# Patient Record
Sex: Female | Born: 1935 | Race: White | Hispanic: No
Health system: Southern US, Community
[De-identification: ages and names within clinical notes are randomized; demographics above are authoritative.]

---

## 2005-07-10 ENCOUNTER — Ambulatory Visit: Payer: Self-pay | Admitting: Family Medicine

## 2006-05-07 ENCOUNTER — Ambulatory Visit: Payer: Self-pay | Admitting: Family Medicine

## 2006-05-21 ENCOUNTER — Ambulatory Visit: Payer: Self-pay | Admitting: Family Medicine

## 2007-07-01 ENCOUNTER — Other Ambulatory Visit: Payer: Self-pay

## 2007-07-01 ENCOUNTER — Ambulatory Visit: Payer: Self-pay | Admitting: Urology

## 2007-07-15 ENCOUNTER — Ambulatory Visit: Payer: Self-pay | Admitting: Urology

## 2007-11-13 ENCOUNTER — Ambulatory Visit: Payer: Self-pay | Admitting: Family Medicine

## 2009-08-30 ENCOUNTER — Ambulatory Visit: Payer: Self-pay | Admitting: Urology

## 2009-09-08 ENCOUNTER — Ambulatory Visit: Payer: Self-pay | Admitting: Urology

## 2009-09-20 ENCOUNTER — Ambulatory Visit: Payer: Self-pay | Admitting: Urology

## 2012-05-29 ENCOUNTER — Inpatient Hospital Stay: Payer: Self-pay | Admitting: Orthopedic Surgery

## 2012-05-29 LAB — URINALYSIS, COMPLETE
Ketone: NEGATIVE
Nitrite: NEGATIVE
RBC,UR: 4 /HPF (ref 0–5)
Squamous Epithelial: 1
WBC UR: 2 /HPF (ref 0–5)

## 2012-05-29 LAB — BASIC METABOLIC PANEL
BUN: 13 mg/dL (ref 7–18)
Calcium, Total: 9.1 mg/dL (ref 8.5–10.1)
EGFR (African American): >60
Glucose: 110 mg/dL — ABNORMAL HIGH (ref 65–99)
Osmolality: 276 (ref 275–301)

## 2012-05-29 LAB — PROTIME-INR: INR: 0.8

## 2012-06-02 ENCOUNTER — Encounter: Payer: Self-pay | Admitting: Internal Medicine

## 2012-06-09 ENCOUNTER — Encounter: Payer: Self-pay | Admitting: Internal Medicine

## 2012-09-10 ENCOUNTER — Ambulatory Visit: Payer: Self-pay | Admitting: Ophthalmology

## 2012-09-10 LAB — POTASSIUM: Potassium: 4.4 mmol/L (ref 3.5–5.1)

## 2012-09-22 ENCOUNTER — Ambulatory Visit: Payer: Self-pay | Admitting: Ophthalmology

## 2012-10-28 ENCOUNTER — Ambulatory Visit: Payer: Self-pay | Admitting: Ophthalmology

## 2012-10-28 LAB — POTASSIUM: Potassium: 3.8 mmol/L (ref 3.5–5.1)

## 2012-11-10 ENCOUNTER — Ambulatory Visit: Payer: Self-pay | Admitting: Ophthalmology

## 2012-12-01 ENCOUNTER — Ambulatory Visit: Payer: Self-pay | Admitting: Family Medicine

## 2012-12-01 LAB — CBC WITH DIFFERENTIAL/PLATELET
Basophil #: 0.1 10*3/uL (ref 0.0–0.1)
Basophil %: 1.1 %
Eosinophil #: 0.1 10*3/uL (ref 0.0–0.7)
HGB: 11.7 g/dL — ABNORMAL LOW (ref 12.0–16.0)
Lymphocyte #: 2.1 10*3/uL (ref 1.0–3.6)
MCH: 30.2 pg (ref 26.0–34.0)
MCHC: 32.1 g/dL (ref 32.0–36.0)
Monocyte #: 0.8 x10 3/mm (ref 0.2–0.9)
Neutrophil #: 7.2 10*3/uL — ABNORMAL HIGH (ref 1.4–6.5)
Neutrophil %: 70 %
Platelet: 283 10*3/uL (ref 150–440)

## 2012-12-01 LAB — COMPREHENSIVE METABOLIC PANEL
Alkaline Phosphatase: 86 U/L (ref 50–136)
Anion Gap: 7 (ref 7–16)
Bilirubin,Total: 0.8 mg/dL (ref 0.2–1.0)
Calcium, Total: 9.1 mg/dL (ref 8.5–10.1)
Chloride: 105 mmol/L (ref 98–107)
Co2: 26 mmol/L (ref 21–32)
Creatinine: 0.99 mg/dL (ref 0.60–1.30)
EGFR (African American): >60
EGFR (Non-African Amer.): 52 — ABNORMAL LOW
Osmolality: 276 (ref 275–301)
Potassium: 4.1 mmol/L (ref 3.5–5.1)
SGOT(AST): 27 U/L (ref 15–37)
Sodium: 138 mmol/L (ref 136–145)

## 2012-12-01 LAB — TSH: Thyroid Stimulating Horm: 1.16 u[IU]/mL

## 2012-12-01 LAB — PRO B NATRIURETIC PEPTIDE: B-Type Natriuretic Peptide: 9293 pg/mL — ABNORMAL HIGH (ref 0–450)

## 2012-12-03 ENCOUNTER — Inpatient Hospital Stay: Payer: Self-pay | Admitting: Internal Medicine

## 2012-12-03 LAB — CBC
HGB: 12.2 g/dL (ref 12.0–16.0)
MCH: 31.2 pg (ref 26.0–34.0)
MCHC: 32.7 g/dL (ref 32.0–36.0)
MCV: 96 fL (ref 80–100)
Platelet: 272 10*3/uL (ref 150–440)
RBC: 3.92 10*6/uL (ref 3.80–5.20)
RDW: 13.6 % (ref 11.5–14.5)
WBC: 10.2 10*3/uL (ref 3.6–11.0)

## 2012-12-03 LAB — COMPREHENSIVE METABOLIC PANEL
Anion Gap: 9 (ref 7–16)
BUN: 14 mg/dL (ref 7–18)
Bilirubin,Total: 0.6 mg/dL (ref 0.2–1.0)
Calcium, Total: 8.5 mg/dL (ref 8.5–10.1)
Chloride: 104 mmol/L (ref 98–107)
EGFR (African American): 48 — ABNORMAL LOW
Glucose: 124 mg/dL — ABNORMAL HIGH (ref 65–99)
Potassium: 3.8 mmol/L (ref 3.5–5.1)
SGOT(AST): 34 U/L (ref 15–37)
SGPT (ALT): 20 U/L (ref 12–78)

## 2012-12-03 LAB — TSH: Thyroid Stimulating Horm: 0.925 u[IU]/mL

## 2012-12-03 LAB — CK TOTAL AND CKMB (NOT AT ARMC): CK-MB: 4.5 ng/mL — ABNORMAL HIGH (ref 0.5–3.6)

## 2012-12-03 LAB — PRO B NATRIURETIC PEPTIDE: B-Type Natriuretic Peptide: 10066 pg/mL — ABNORMAL HIGH (ref 0–450)

## 2012-12-04 LAB — LIPID PANEL
Cholesterol: 141 mg/dL (ref 0–200)
HDL Cholesterol: 45 mg/dL (ref 40–60)
Ldl Cholesterol, Calc: 66 mg/dL (ref 0–100)
Triglycerides: 152 mg/dL (ref 0–200)
VLDL Cholesterol, Calc: 30 mg/dL (ref 5–40)

## 2012-12-04 LAB — CBC WITH DIFFERENTIAL/PLATELET
Basophil #: 0.1 10*3/uL (ref 0.0–0.1)
Basophil %: 1 %
Eosinophil #: 0.3 10*3/uL (ref 0.0–0.7)
HCT: 38.2 % (ref 35.0–47.0)
HGB: 12.7 g/dL (ref 12.0–16.0)
Lymphocyte %: 19.3 %
Monocyte %: 8.8 %
Neutrophil %: 68.4 %
Platelet: 274 10*3/uL (ref 150–440)
RBC: 4.07 10*6/uL (ref 3.80–5.20)
RDW: 13.7 % (ref 11.5–14.5)
WBC: 11.8 10*3/uL — ABNORMAL HIGH (ref 3.6–11.0)

## 2012-12-04 LAB — BASIC METABOLIC PANEL
Anion Gap: 11 (ref 7–16)
Calcium, Total: 8.7 mg/dL (ref 8.5–10.1)
Co2: 24 mmol/L (ref 21–32)
EGFR (Non-African Amer.): 41 — ABNORMAL LOW
Glucose: 103 mg/dL — ABNORMAL HIGH (ref 65–99)
Osmolality: 276 (ref 275–301)
Potassium: 4.3 mmol/L (ref 3.5–5.1)

## 2012-12-05 LAB — CBC WITH DIFFERENTIAL/PLATELET
Basophil #: 0 10*3/uL (ref 0.0–0.1)
Eosinophil %: 0.1 %
HCT: 38.6 % (ref 35.0–47.0)
HGB: 13 g/dL (ref 12.0–16.0)
MCH: 31.5 pg (ref 26.0–34.0)
MCHC: 33.6 g/dL (ref 32.0–36.0)
Monocyte #: 2 x10 3/mm — ABNORMAL HIGH (ref 0.2–0.9)
Monocyte %: 11.8 %
Neutrophil %: 78.8 %
Platelet: 296 10*3/uL (ref 150–440)
RBC: 4.12 10*6/uL (ref 3.80–5.20)
RDW: 13.6 % (ref 11.5–14.5)

## 2012-12-05 LAB — BASIC METABOLIC PANEL
Anion Gap: 10 (ref 7–16)
Calcium, Total: 9.1 mg/dL (ref 8.5–10.1)
Chloride: 93 mmol/L — ABNORMAL LOW (ref 98–107)
Co2: 28 mmol/L (ref 21–32)
EGFR (African American): 38 — ABNORMAL LOW
Potassium: 4.7 mmol/L (ref 3.5–5.1)
Sodium: 131 mmol/L — ABNORMAL LOW (ref 136–145)

## 2012-12-05 LAB — MAGNESIUM: Magnesium: 2.4 mg/dL

## 2012-12-06 LAB — CBC WITH DIFFERENTIAL/PLATELET
Basophil #: 0.1 10*3/uL (ref 0.0–0.1)
Basophil #: 0.1 10*3/uL (ref 0.0–0.1)
Basophil %: 0.6 %
Eosinophil #: 0 10*3/uL (ref 0.0–0.7)
Eosinophil #: 0 10*3/uL (ref 0.0–0.7)
HCT: 37.2 % (ref 35.0–47.0)
HCT: 37.1 % (ref 35.0–47.0)
HGB: 12.7 g/dL (ref 12.0–16.0)
Lymphocyte #: 1.9 10*3/uL (ref 1.0–3.6)
Lymphocyte %: 12.7 %
MCHC: 34 g/dL (ref 32.0–36.0)
MCHC: 33 g/dL (ref 32.0–36.0)
Monocyte %: 7.3 %
Monocyte %: 8.6 %
Neutrophil #: 14.7 10*3/uL — ABNORMAL HIGH (ref 1.4–6.5)
Neutrophil #: 11.2 10*3/uL — ABNORMAL HIGH (ref 1.4–6.5)
Neutrophil %: 77.9 %
Platelet: 271 10*3/uL (ref 150–440)
RBC: 3.92 10*6/uL (ref 3.80–5.20)
RBC: 3.93 10*6/uL (ref 3.80–5.20)
RDW: 13.8 % (ref 11.5–14.5)
WBC: 18.1 10*3/uL — ABNORMAL HIGH (ref 3.6–11.0)
WBC: 14.4 10*3/uL — ABNORMAL HIGH (ref 3.6–11.0)

## 2012-12-06 LAB — URINALYSIS, COMPLETE
Bacteria: NONE SEEN
Bilirubin,UR: NEGATIVE
Glucose,UR: NEGATIVE mg/dL (ref 0–75)
Hyaline Cast: 4
Ketone: NEGATIVE
Leukocyte Esterase: NEGATIVE
Nitrite: NEGATIVE
Ph: 5 (ref 4.5–8.0)
Squamous Epithelial: NONE SEEN
WBC UR: 7 /HPF (ref 0–5)

## 2012-12-06 LAB — TROPONIN I: Troponin-I: 0.75 ng/mL — ABNORMAL HIGH

## 2012-12-06 LAB — BASIC METABOLIC PANEL
Calcium, Total: 8.2 mg/dL — ABNORMAL LOW (ref 8.5–10.1)
Co2: 22 mmol/L (ref 21–32)
Creatinine: 1.56 mg/dL — ABNORMAL HIGH (ref 0.60–1.30)
EGFR (African American): 35 — ABNORMAL LOW
EGFR (Non-African Amer.): 30 — ABNORMAL LOW
Potassium: 5 mmol/L (ref 3.5–5.1)
Sodium: 131 mmol/L — ABNORMAL LOW (ref 136–145)

## 2012-12-06 LAB — APTT
Activated PTT: 31.4 secs (ref 23.6–35.9)
Activated PTT: 75.4 secs — ABNORMAL HIGH (ref 23.6–35.9)

## 2012-12-06 LAB — MAGNESIUM: Magnesium: 2.8 mg/dL — ABNORMAL HIGH

## 2012-12-07 LAB — CBC WITH DIFFERENTIAL/PLATELET
Eosinophil #: 0.1 10*3/uL (ref 0.0–0.7)
Eosinophil %: 0.7 %
HCT: 33.4 % — ABNORMAL LOW (ref 35.0–47.0)
Lymphocyte #: 2.9 10*3/uL (ref 1.0–3.6)
MCH: 33.9 pg (ref 26.0–34.0)
MCV: 95 fL (ref 80–100)
Monocyte #: 1.2 x10 3/mm — ABNORMAL HIGH (ref 0.2–0.9)
Monocyte %: 9.1 %
Neutrophil #: 8.9 10*3/uL — ABNORMAL HIGH (ref 1.4–6.5)
RDW: 13.5 % (ref 11.5–14.5)
WBC: 13 10*3/uL — ABNORMAL HIGH (ref 3.6–11.0)

## 2012-12-07 LAB — BASIC METABOLIC PANEL
BUN: 29 mg/dL — ABNORMAL HIGH (ref 7–18)
Calcium, Total: 7.7 mg/dL — ABNORMAL LOW (ref 8.5–10.1)
Co2: 24 mmol/L (ref 21–32)
Creatinine: 1.37 mg/dL — ABNORMAL HIGH (ref 0.60–1.30)
EGFR (African American): 41 — ABNORMAL LOW
EGFR (Non-African Amer.): 35 — ABNORMAL LOW
Potassium: 4.5 mmol/L (ref 3.5–5.1)
Sodium: 131 mmol/L — ABNORMAL LOW (ref 136–145)

## 2012-12-07 LAB — APTT
Activated PTT: 67 secs — ABNORMAL HIGH (ref 23.6–35.9)
Activated PTT: 57.5 secs — ABNORMAL HIGH (ref 23.6–35.9)
Activated PTT: 71 secs — ABNORMAL HIGH (ref 23.6–35.9)

## 2012-12-07 LAB — URINE CULTURE

## 2012-12-07 LAB — PROTEIN, URINE, RANDOM: Protein, Random Urine: <5 mg/dL — ABNORMAL LOW (ref 0–12)

## 2012-12-07 LAB — PRO B NATRIURETIC PEPTIDE: B-Type Natriuretic Peptide: 12385 pg/mL — ABNORMAL HIGH (ref 0–450)

## 2012-12-08 LAB — BASIC METABOLIC PANEL
BUN: 25 mg/dL — ABNORMAL HIGH (ref 7–18)
Calcium, Total: 8.1 mg/dL — ABNORMAL LOW (ref 8.5–10.1)
Co2: 26 mmol/L (ref 21–32)
EGFR (African American): 49 — ABNORMAL LOW
EGFR (Non-African Amer.): 42 — ABNORMAL LOW
Glucose: 114 mg/dL — ABNORMAL HIGH (ref 65–99)
Osmolality: 268 (ref 275–301)
Sodium: 131 mmol/L — ABNORMAL LOW (ref 136–145)

## 2012-12-08 LAB — CBC WITH DIFFERENTIAL/PLATELET
Basophil %: 0.4 %
Eosinophil #: 0.3 10*3/uL (ref 0.0–0.7)
Eosinophil %: 2.4 %
HCT: 33.3 % — ABNORMAL LOW (ref 35.0–47.0)
HGB: 10.9 g/dL — ABNORMAL LOW (ref 12.0–16.0)
Lymphocyte #: 3.3 10*3/uL (ref 1.0–3.6)
MCH: 30.7 pg (ref 26.0–34.0)
MCV: 94 fL (ref 80–100)
Monocyte #: 0.7 x10 3/mm (ref 0.2–0.9)
Neutrophil #: 7.4 10*3/uL — ABNORMAL HIGH (ref 1.4–6.5)
Neutrophil %: 62.9 %
Platelet: 247 10*3/uL (ref 150–440)
RBC: 3.54 10*6/uL — ABNORMAL LOW (ref 3.80–5.20)

## 2012-12-08 LAB — APTT: Activated PTT: 72.4 secs — ABNORMAL HIGH (ref 23.6–35.9)

## 2012-12-09 LAB — BASIC METABOLIC PANEL
BUN: 14 mg/dL (ref 7–18)
Chloride: 98 mmol/L (ref 98–107)
Co2: 27 mmol/L (ref 21–32)
Creatinine: 1.09 mg/dL (ref 0.60–1.30)
EGFR (Non-African Amer.): 46 — ABNORMAL LOW
Glucose: 103 mg/dL — ABNORMAL HIGH (ref 65–99)
Sodium: 134 mmol/L — ABNORMAL LOW (ref 136–145)

## 2012-12-09 LAB — UR PROT ELECTROPHORESIS, URINE RANDOM

## 2012-12-09 LAB — APTT: Activated PTT: 61.4 secs — ABNORMAL HIGH (ref 23.6–35.9)

## 2012-12-11 LAB — PROTEIN ELECTROPHORESIS(ARMC)

## 2013-01-10 DEATH — deceased

## 2014-08-29 IMAGING — CR DG CHEST 1V PORT
1 series · 1 of 1 positions shown · non-contrast
Comparison: none

REASON FOR EXAM: chf- SOB
COMMENTS:

PROCEDURE:     DXR - DXR PORTABLE CHEST SINGLE VIEW  - December 07, 2012  [DATE]
RESULT:     Frontal view of chest dated 12/07/2012.

[ap]
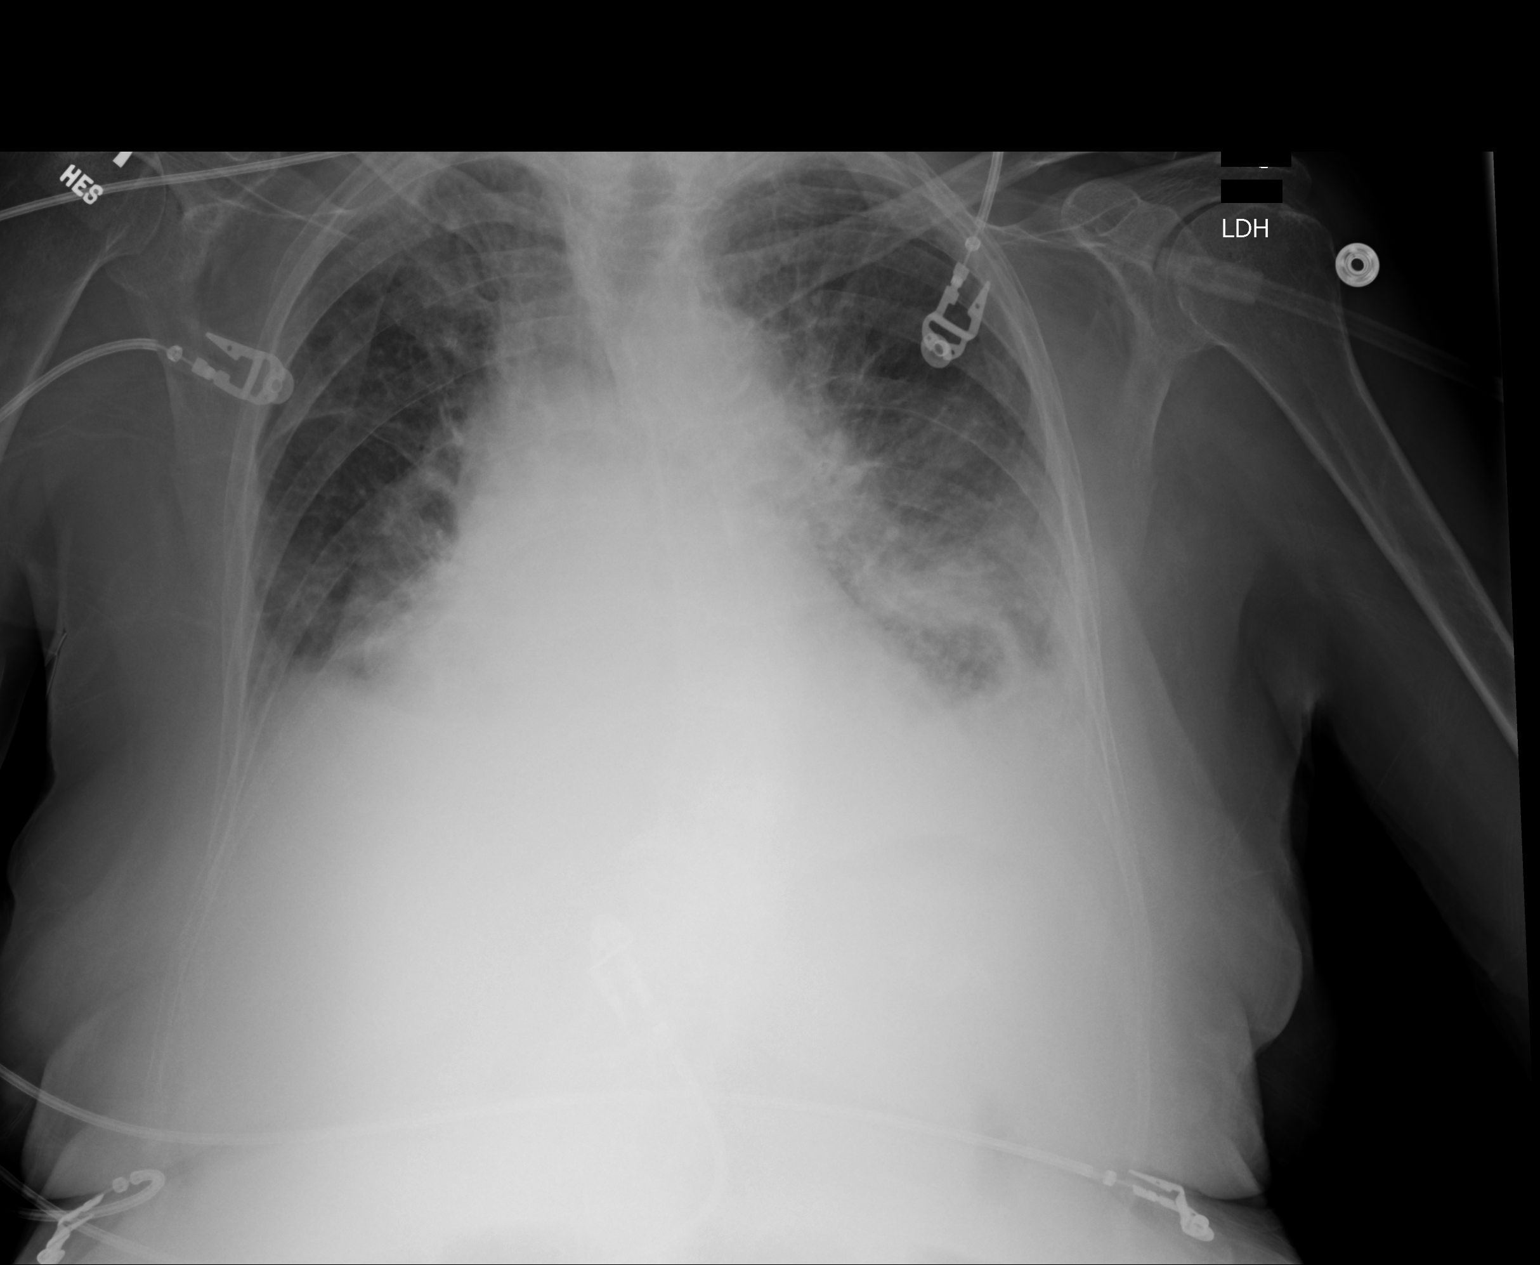

[1 of 1 positions shown; findings below may reference images not displayed]

FINDINGS: The bones of interstitial markings and peribronchial cuffing.
There is increased density projects in the right left lung bases. Cardiac
silhouette is enlarged. Visualized bony skeleton is unremarkable.
IMPRESSION: Interstitial infiltrate likely reflecting pulmonary edema.
2. Asymmetric edema versus nonedematous focal infiltrates in lung bases.
Small effusions also diagnostic consideration. Surveillance evaluation
recommended status post appropriate therapeutic regiment.

## 2015-03-29 NOTE — Consult Note (Signed)
General Aspect 81 yo female with history of hyperlipidemia and hypertension who was admitted after developing shortness of breath over the psat week. She presented to her pcp office and underwent lab tests. Her bnp was significantly elevated and she was told to come to the er. She complinas of shortness of breath that has gradually increased over the past wseek. She denies chest pain. She states she has peripheral edema at home and that it had increased somewhat recently. She has evidence of a mild troponiin elevation to 1.0.  EKG reveals lbbb which has been chronic. SHe is currently hemodynamically stable. Her cxr reveals evidence of pulmonary edema. Echo is pending.   Physical Exam:   GEN obese    HEENT PERRL    NECK supple    RESP no use of accessory muscles  crackles    CARD Regular rate and rhythm  Normal, S1, S2  Murmur    Murmur Systolic    Systolic Murmur axilla    ABD denies tenderness  normal BS    LYMPH negative neck, negative axillae    EXTR negative cyanosis/clubbing, positive edema    SKIN normal to palpation    NEURO cranial nerves intact, motor/sensory function intact    PSYCH anxious   Review of Systems:   Subjective/Chief Complaint shortness of breath; weakness; fatigue; no energy    General: Fatigue  Weakness    Skin: No Complaints    ENT: No Complaints    Eyes: No Complaints    Neck: No Complaints    Respiratory: Short of breath    Cardiovascular: Dyspnea  Edema    Gastrointestinal: No Complaints    Genitourinary: No Complaints    Vascular: No Complaints    Musculoskeletal: Muscle or joint pain    Neurologic: No Complaints    Hematologic: No Complaints    Endocrine: No Complaints    Psychiatric: No Complaints    Review of Systems: All other systems were reviewed and found to be negative    Medications/Allergies Reviewed Medications/Allergies reviewed     Right forearm fracture:    Hypercholesterolemia:    htn:     Hysterectomy - Partial:        Admit Diagnosis:   ACUTE CHF: 04-Dec-2012, Active, ACUTE CHF  Home Medications: Medication Instructions Status  clonazepam 0.5 mg oral tablet 1 tab(s) orally once a day (at bedtime), As Needed for restless leg syndrome Active  levothyroxine 100 mcg (0.1 mg) oral tablet 1 tab(s) orally once a day Active  ranitidine 150 mg oral capsule 1 cap(s) orally 2 times a day Active  amitriptyline 50 mg oral tablet 1 tab(s) orally once a day (at bedtime) Active  simvastatin 20 mg oral tablet 1 tab(s) orally once a day (at bedtime) Active  bisoprolol-hydrochlorothiazide 5 mg-6.25 mg oral tablet 1 tab(s) orally once a day Active  methocarbamol 500 mg oral tablet 1 tab(s) orally 2 times a day Active  alendronate 70 mg oral tablet 1 tab(s) orally once a week Active  latanoprost 0.005% ophthalmic solution 1 drop(s) to each affected eye once a day (at bedtime) Active  aspirin 81 mg oral tablet 1 tab(s) orally once a day Active  calcium carbonate 500 mg (200 mg elemental calcium) oral tablet, chewable 1 tab(s) orally 3 times a day Active   EKG:   Interpretation lbbb    Ibuprofen: Other    Impression 10 yho female with no cardiac history who was admitted after developing shortness of breath and being noted  to have elevated bnp. Her cxr in the er revealed pulmonary edema. She has a chornic lbbb. No chest pain. She has a mild serum troponin elevation that is liklely secondary to pulmonary edema. Echo to evaluate lv funciton is pending. Improving witgh diuresis    Plan 1. Continue with agressive diuresis following electrolytes, cxr and symptoms 2. Will review echo when available 3. OK to transfer to telemetry 4. COnintue with beta blockers, nitrates. 5. Further invasive vs noninvasive workup depending on course and echo data   Electronic Signatures: Dalia HeadingFath, Keagen Heinlen A (MD)  (Signed 26-Dec-13 09:18)  Authored: General Aspect/Present Illness, History and Physical Exam, Review  of System, Past Medical History, Health Issues, Home Medications, EKG , Allergies, Impression/Plan   Last Updated: 26-Dec-13 09:18 by Dalia HeadingFath, Dartanyon Frankowski A (MD)

## 2015-03-29 NOTE — Op Note (Signed)
PATIENT NAME:  Sylvester HarderMOSS, Sharry P MR#:  [id number] DATE OF BIRTH:  Mar 24, 1925  DATE OF PROCEDURE:  11/10/2012  PREOPERATIVE DIAGNOSIS:  Cataract, left eye.    POSTOPERATIVE DIAGNOSIS:  Cataract, left eye.  PROCEDURE PERFORMED:  Extracapsular cataract extraction using phacoemulsification with placement of an Alcon SN6CWS, 24-diopter posterior chamber lens, serial #[id number].   SURGEON:  Maylon PeppersSteven A. Nailyn Dearinger, MD  ASSISTANT:  None.  ANESTHESIA:  4% lidocaine and 0.75% Marcaine in a 50/50 mixture with 10 units/mL of Hylenex added, given as a peribulbar.   ANESTHESIOLOGIST:  Randall AnGjibertus Van Staveren, MD  COMPLICATIONS:  None.  ESTIMATED BLOOD LOSS:  Less than 1 ml.  DESCRIPTION OF PROCEDURE:  The patient was brought to the operating room and given a peribulbar block.  The patient was then prepped and draped in the usual fashion.  The vertical rectus muscles were imbricated using 5-0 silk sutures.  These sutures were then clamped to the sterile drapes as bridle sutures.  A limbal peritomy was performed extending two clock hours and hemostasis was obtained with cautery.  A partial thickness scleral groove was made at the surgical limbus and dissected anteriorly in a lamellar dissection using an Alcon crescent knife.  The anterior chamber was entered supero-temporally with a Superblade and through the lamellar dissection with a 2.6 mm keratome.  DisCoVisc was used to replace the aqueous and a continuous tear capsulorrhexis was carried out.  Hydrodissection and hydrodelineation were carried out with balanced salt and a 27 gauge canula.  The nucleus was rotated to confirm the effectiveness of the hydrodissection.  Phacoemulsification was carried out using a divide-and-conquer technique.  Total ultrasound time was 1 minute and 17.7 seconds with an average power of 18.8 percent and CDE of 25.11.  Irrigation/aspiration was used to remove the residual cortex.  DisCoVisc was used to inflate the capsule and  the internal incision was enlarged to 3 mm with the crescent knife.  The intraocular lens was folded and inserted into the capsular bag using the AcrySert delivery system.  Irrigation/aspiration was used to remove the residual DisCoVisc.  Miostat was injected into the anterior chamber through the paracentesis track to inflate the anterior chamber and induce miosis.  The wound was checked for leaks and none were found. The conjunctiva was closed with cautery and the bridle sutures were removed.  Two drops of 0.3% Vigamox were placed on the eye.   An eye shield was placed on the eye.  The patient was discharged to the recovery room in good condition.  ____________________________ Maylon PeppersSteven A. Domique Reardon, MD sad:slb D: 11/10/2012 13:10:22 ET T: 11/10/2012 13:15:47 ET JOB#: [id number]  cc: Viviann SpareSteven A. Katielynn Horan, MD, <Dictator> Erline LevineSTEVEN A Arianah Torgeson MD ELECTRONICALLY SIGNED 11/17/2012 12:53

## 2015-03-29 NOTE — Op Note (Signed)
PATIENT NAME:  Sheila Crosby, Sheila Crosby MR#:  [id number] DATE OF BIRTH:  1925-10-09  DATE OF PROCEDURE:  09/22/2012  PREOPERATIVE DIAGNOSIS:  Cataract, right eye.   POSTOPERATIVE DIAGNOSIS:  Cataract, right eye.  PROCEDURE PERFORMED:  Extracapsular cataract extraction using phacoemulsification with placement of an Alcon SN6CWS, 23.5-diopter posterior chamber lens, serial [id number].  SURGEON:  Maylon PeppersSteven A. Thelbert Gartin, MD  ASSISTANT:  None.  ANESTHESIA:  4% lidocaine and 0.75% Marcaine in a 50/50 mixture with 10 units/mL of Hylenex added, given as a peribulbar.  ANESTHESIOLOGIST:  Dr. Noralyn Pickarroll.  COMPLICATIONS:  None.  ESTIMATED BLOOD LOSS:  Less than 1 mL.  DESCRIPTION OF PROCEDURE:  The patient was brought to the operating room and given a peribulbar block.  The patient was then prepped and draped in the usual fashion.  The vertical rectus muscles were imbricated using 5-0 silk sutures.  These sutures were then clamped to the sterile drapes as bridle sutures.  A limbal peritomy was performed extending two clock hours and hemostasis was obtained with cautery.  A partial thickness scleral groove was made at the surgical limbus and dissected anteriorly in a lamellar dissection using an Alcon crescent knife.  The anterior chamber was entered superonasally with a Superblade and through the lamellar dissection with a 2.6 mm keratome.  DisCoVisc was used to replace the aqueous and a continuous tear capsulorrhexis was carried out.  Hydrodissection and hydrodelineation were carried out with balanced salt and a 27 gauge canula.  The nucleus was rotated to confirm the effectiveness of the hydrodissection.  Phacoemulsification was carried out using a divide-and-conquer technique.  Total ultrasound time was 1 minute and 35 seconds with an average power of 19.3 percent. CDE 31.  Irrigation/aspiration was used to remove the residual cortex.  DisCoVisc was used to inflate the capsule and the internal incision was  enlarged to 3 mm with the crescent knife.  The intraocular lens was folded and inserted into the capsular bag using the AcrySert delivery system.  Irrigation/aspiration was used to remove the residual DisCoVisc.  Miostat was injected into the anterior chamber through the paracentesis track to inflate the anterior chamber and induce miosis.  The wound was checked for leaks and none were found. The conjunctiva was closed with cautery and the bridle sutures were removed.  Two drops of 0.3% Vigamox were placed on the eye.   An eye shield was placed on the eye.  The patient was discharged to the recovery room in good condition.  ____________________________ Maylon PeppersSteven A. Avilene Marrin, MD sad:cms D: 09/22/2012 13:56:09 ET T: 09/22/2012 14:07:10 ET JOB#: [id number]  cc: Viviann SpareSteven A. Katilin Raynes, MD, <Dictator> Erline LevineSTEVEN A Lorien Shingler MD ELECTRONICALLY SIGNED 09/29/2012 13:35

## 2015-04-01 NOTE — Discharge Summary (Signed)
PATIENT NAME:  Sheila Crosby, Sheila Crosby MR#:  [id number] DATE OF BIRTH:  19-Aug-1925  DATE OF ADMISSION:  12/03/2012 DATE OF DISCHARGE:  12/09/2012  DISCHARGE DIAGNOSES:  Acute systolic heart failure, acute myocardial infarction, acute renal failure, new-onset atrial fibrillation, triple-vessel coronary artery disease as per cardiac catheterization, hyperlipidemia.   HISTORY OF PRESENT ILLNESS: An 81 year old female with history of hypertension and hyperlipidemia, GERD, glaucoma, hypothyroidism and peptic ulcer disease, came to the Emergency Room because she was being at home and called her primary care physician for result of tests drawn a couple of days ago and was told to come directly to the Emergency Room. Results were abnormal. As far as the patient said last Friday, that was the Friday before Christmas, started having some increased shortness of breath and tightness in her chest. The tightness subsided after a while but she was getting really short of breath every time she tried to  ambulate and later on even while resting. She also had mild chest pain so she went to her primary care doctor, Dr. Elease Hashimoto, and she called her later on because her BNP was really elevated. As per the family, the patient was very active, living alone at home, and was serving as Education officer, community in some school but since last few days, she was feeling very short of breath. She also had chest pain which was radiating to the arm sometimes. She had very low blood pressure in the 90s and 80s before coming to the hospital. She talked to her primary care physician about that and she advised to stop some of the blood pressure medication as she was taking for blood pressure. In the ER, chest x-ray showed significant pulmonary congestion. EKG showed some changes compared to the EKG that was done in the past. There was little  provisioning of QRS in septal area on EKG, inversion of T wave with some ST depression about 1 to 2 mm in the lateral  leads, some left bundle branch block also, and intraventricular conduction defect so she was admitted with diagnosis of acute systolic congestive heart failure, new-onset, started on IV Lasix. Troponin on admission was 1.01 so she was also getting a diagnosis of non-ST elevation MI. She was initially advised to start on Lovenox subcu but her renal function was worsening. On presentation, her creatinine was 1.19 which went on the next day 1.46, so we switched her from Lovenox to heparin IV drip. She was maintained initially on telemetry floor but then she had episode, on the second hospital day, of a rapid ventricular rate with A. fib., the heart rate was ranging up to 150 to 160 and her blood pressure was dropping to the 80s. She was given IV Cardizem injection but just after a few hours she again started having tachycardia so she was transferred back to ICU and started on amiodarone drip. The next day, cardiologist saw her and started her on amiodarone orally and her heart rate came down slowly with that and remained under control. We also added further during the hospital course for her A. fib. we added beta blockers to keep it more controlled. For systolic congestive heart failure, she was managed on IV Lasix. She had some worsening of renal function. The creatine highest went up to 1.56 and we had to consult nephrology service for that so we backed up a little bit on Lasix and had to hydrate her a little but then she had again a little more congestion. Her BNP on admission was  10,000 and after 2 to 3 days and a little IV hydration it went up to 12,000 so we stopped IV fluids and again did aggressive diuresis and she improved symptomatically also as well as her renal function also slowly improved. Finally, on the day of discharge/transfer, her creatinine came 1.09. During the hospital course, she was also maintained on IV heparin drip because of her non-ST elevation MI and cardiologists were waiting for  correction of the renal function to do the cardiac cath. On the final day, on 31st of December, when creatinine went up to 1.09, cardiac cath was done which showed triple-vessel disease and that is why she is advised to be transferred to a higher care facility for coronary artery bypass graft.   OTHER MEDICAL ISSUES ADDRESSED DURING THE HOSPITAL STAY: Acute on chronic renal failure. As mentioned above, renal function improved slowly and she is given acetylcysteine for protection of the kidney from dye that she received in cardiac catheterization. Dyslipidemia, she was continued on statin. Leg cramps, we continued tramadol as she needed. Hypomagnesemia, replaced. Hypokalemia, replaced. Agitation, she was started on Xanax and continued.   IMPORTANT LAB RESULTS DURING THE HOSPITAL STAY: CARDIAC CATH: Acute non-ST elevation myocardial infarction, ostial LAD 100% stenosis, good collateral blood supply to the distal, mid circumflex 90% stenosis, second obtuse marginal 75% stenosis, ostial RCA. 80% stenosis, proximal RCA 90% stenosis, mid RCA 99% stenosis; recommendation consultation for CABG.  Consult cardiology and workup as mentioned above. ECHOCARDIOGRAM: Left ventricle mildly dilated, moderate anterior wall hypokinesis, mild tricuspid regurgitation. Ejection fraction 25% to 30%, systolic function is moderate to severely reduced, apical akinesis, no pericardial effusion, mild mitral stenosis.   CODE STATUS: Full code.   CONDITION ON DISCHARGE: Guarded.   DISCHARGE MEDICATIONS:  Clonazepam 1.5 mg orally at bedtime as needed, levothyroxine 100 mcg once a day, ranitidine 150 mg 2 times a day, amitriptyline 50 mg orally once a day, simvastatin 20 mg once a day, methocarbamol 500 mg oral tablet 2 times a day, alendronate 70 mg once a week, latanoprost 0.005% ophthalmic solution each affected eye once a day, aspirin 81 mg once a day, calcium carbonate 500 mg 3 times a day, amiodarone 400 mg oral tablet once a day,  Lasix 20 mg oral tablet once a day, potassium chloride 20 mEq oral tablet extended-release once a day, Coreg 3.125 mg oral tablet 2 times a day, lisinopril 2.5 mg oral tablet once a day, acetylcysteine 600 mg oral capsule 2 capsules every 12 hours and heparin IV drip to be continued on transfer.   HOME HEALTH: No.   OXYGEN: No.   DIET: Low sodium, low fat, low cholesterol diet, carbohydrate-controlled ADA diet.   DISPOSITION:  [facility].   Timeframe to followup as advised after discharge from there.   TOTAL TIME SPENT ON DISCHARGE: 45 minutes.      ____________________________ Hope PigeonVaibhavkumar G. Elisabeth PigeonVachhani, MD vgv:cs D: 12/09/2012 14:46:00 ET T: 12/09/2012 15:27:36 ET JOB#: [id number]  cc: Hope PigeonVaibhavkumar G. Elisabeth PigeonVachhani, MD, <Dictator> Altamese DillingVAIBHAVKUMAR Coty Student MD ELECTRONICALLY SIGNED 01/20/2013 15:07
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
## 2015-04-03 NOTE — Op Note (Signed)
PATIENT NAME:  Sheila Crosby, Sheila Crosby MR#:  [id number] DATE OF BIRTH:  02-Mar-1925  DATE OF PROCEDURE:  05/30/2012  PREOPERATIVE DIAGNOSES:  1. Distal both bone forearm fracture. 2. Acute carpal tunnel syndrome.   POSTOPERATIVE DIAGNOSES:  1. Distal both bone forearm fracture. 2. Acute carpal tunnel syndrome.   PROCEDURES:  1. Open reduction internal fixation radius and ulna shaft.  2. Carpal tunnel release.   SURGEON: Leitha SchullerMichael Crosby. Tuwanna Krausz, MD  ANESTHESIA: General.    DESCRIPTION OF PROCEDURE: Patient was brought to the Operating Room and after adequate anesthesia was obtained, the right arm was prepped and draped in the usual sterile fashion with a tourniquet applied to the upper arm. After patient identification and timeout procedures were completed, the tourniquet was raised to 250 mmHg. An incision was made in line with the ring metacarpal in palm approximately an inch in length. Subcutaneous tissue was spread and the transverse carpal ligament was identified. A small incision was made in the transverse carpal ligament and a hemostat placed underneath this to protect it with retractors placed proximally and distally. The transverse carpal ligament was released. There did appear to be compression and after release proximally there was good vascular blush to the nerve. Adequate decompression had been obtained. The wound was then irrigated and closed with simple interrupted 4-0 nylon skin suture. Next, the radius was approached through a volar approach over the FCR tendon. Incision centered over the FCR tendon with incision down through the skin and subcutaneous tissue. The superficial fascia of the FCR was then incised and the tendon retracted ulnarly. The deep fascia was incised and the pronator and flexors origin was identified and elevated off the radial side to provide exposure of the fracture site. The fracture was quite comminuted with some loose bone fragments that were not attached to soft tissue.  Those were removed. Next, the ulna was approached through a direct approach between the extensor and flexor surfaces of the ulna distally. Subcutaneous tissue spread and cautery used for hemostasis. The ulnar fracture was more easily reduced. It was held in a reduced position. A five hole third tubular plate was applied and filled with cortical screws. This gave indirect reduction of the radius and the radius was more easily reduced at this time. A DVR extended right plate was then used to fix the right radius with four proximal screws, one distal screw and then four smooth pegs using the DVR drill guides. They were drilled, measured, and smooth pegs placed with the excess fast guides removed as the were not needed for stability. There was full pronation and supination at this point and reduction appeared near anatomic, however, because of the comminution and bony defects around the radius and smaller defects in the ulna bone graft was applied with DBX bone putty, 4 mL. The wound was irrigated prior to this and then closed with 2-0 Vicryl subcutaneously and 4-0 nylon for the skin. Xeroform, 4 x 4's, Webril, and volar splint were applied along with Ace wrap. Tourniquet was let down.   TOURNIQUET TIME: 70 minutes.   ESTIMATED BLOOD LOSS: 25 mL.   COMPLICATIONS: None.   SPECIMEN: None.    ____________________________ Leitha SchullerMichael Crosby. Alanni Vader, MD mjm:cms D: 05/30/2012 22:59:32 ET T: 05/31/2012 10:38:52 ET JOB#: [id number]  cc: Leitha SchullerMichael Crosby. Ece Cumberland, MD, <Dictator>  Leitha SchullerMICHAEL Crosby Virgilia Quigg MD ELECTRONICALLY SIGNED 06/01/2012 7:54
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
